# Patient Record
Sex: Male | Born: 1995 | Race: Black or African American | Hispanic: No | Marital: Single
Health system: Southern US, Community
[De-identification: ages and names within clinical notes are randomized; demographics above are authoritative.]

---
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
## 2020-12-14 ENCOUNTER — Emergency Department: Payer: Self-pay

## 2020-12-14 ENCOUNTER — Emergency Department
Admission: EM | Admit: 2020-12-14 | Discharge: 2020-12-14 | Disposition: A | Discharge status: Home / Self Care | Payer: Self-pay | Attending: Emergency Medicine | Admitting: Emergency Medicine

## 2020-12-14 ENCOUNTER — Other Ambulatory Visit: Payer: Self-pay

## 2020-12-14 DIAGNOSIS — M25512 Pain in left shoulder: Secondary | ICD-10-CM | POA: Insufficient documentation

## 2020-12-14 DIAGNOSIS — S0990XA Unspecified injury of head, initial encounter: Secondary | ICD-10-CM | POA: Insufficient documentation

## 2020-12-14 DIAGNOSIS — F10129 Alcohol abuse with intoxication, unspecified: Secondary | ICD-10-CM | POA: Insufficient documentation

## 2020-12-14 DIAGNOSIS — S02602A Fracture of unspecified part of body of left mandible, initial encounter for closed fracture: Secondary | ICD-10-CM | POA: Insufficient documentation

## 2020-12-14 DIAGNOSIS — F10929 Alcohol use, unspecified with intoxication, unspecified: Secondary | ICD-10-CM

## 2020-12-14 DIAGNOSIS — S01412A Laceration without foreign body of left cheek and temporomandibular area, initial encounter: Secondary | ICD-10-CM | POA: Insufficient documentation

## 2020-12-14 DIAGNOSIS — S02609A Fracture of mandible, unspecified, initial encounter for closed fracture: Secondary | ICD-10-CM

## 2020-12-14 DIAGNOSIS — Y9259 Other trade areas as the place of occurrence of the external cause: Secondary | ICD-10-CM | POA: Insufficient documentation

## 2020-12-14 MED ORDER — NAPROXEN 500 MG PO TABS: 500.0000 mg | ORAL_TABLET | Freq: Two times a day (BID) | ORAL | 2 refills | Status: AC
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
## 2020-12-14 NOTE — ED Notes (Signed)
Patient d/c in police custody in wheelchair to police vehicle. Instructions reviewed with patient and officer. Denies any needs or questions
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
## 2020-12-14 NOTE — ED Notes (Signed)
Patient facial wound cleaned with warm water and soap.
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
## 2024-01-22 ENCOUNTER — Emergency Department
Admission: EM | Admit: 2024-01-22 | Discharge: 2024-01-23 | Disposition: A | Discharge status: Other Acute Inpatient Hospital | Payer: Self-pay | Attending: Emergency Medicine | Admitting: Emergency Medicine

## 2024-01-22 ENCOUNTER — Other Ambulatory Visit: Payer: Self-pay

## 2024-01-22 DIAGNOSIS — S31119A Laceration without foreign body of abdominal wall, unspecified quadrant without penetration into peritoneal cavity, initial encounter: Secondary | ICD-10-CM | POA: Diagnosis not present

## 2024-01-22 DIAGNOSIS — S3991XA Unspecified injury of abdomen, initial encounter: Secondary | ICD-10-CM | POA: Diagnosis present

## 2024-01-22 DIAGNOSIS — S36502A Unspecified injury of descending [left] colon, initial encounter: Secondary | ICD-10-CM | POA: Diagnosis not present

## 2024-01-22 DIAGNOSIS — Z23 Encounter for immunization: Secondary | ICD-10-CM | POA: Diagnosis not present

## 2024-01-22 DIAGNOSIS — S40812A Abrasion of left upper arm, initial encounter: Secondary | ICD-10-CM | POA: Diagnosis not present

## 2024-01-22 DIAGNOSIS — S40811A Abrasion of right upper arm, initial encounter: Secondary | ICD-10-CM | POA: Insufficient documentation

## 2024-01-22 DIAGNOSIS — D72829 Elevated white blood cell count, unspecified: Secondary | ICD-10-CM | POA: Insufficient documentation

## 2024-01-22 DIAGNOSIS — S31109A Unspecified open wound of abdominal wall, unspecified quadrant without penetration into peritoneal cavity, initial encounter: Secondary | ICD-10-CM

## 2024-01-22 DIAGNOSIS — S36892A Contusion of other intra-abdominal organs, initial encounter: Secondary | ICD-10-CM | POA: Insufficient documentation
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
## 2024-01-23 ENCOUNTER — Emergency Department: Payer: Self-pay

## 2024-01-23 LAB — CBC WITH DIFFERENTIAL/PLATELET
Abs Immature Granulocytes: 0.04 K/uL (ref 0.00–0.07)
Basophils Absolute: 0.1 K/uL (ref 0.0–0.1)
Basophils Relative: 1 %
Eosinophils Absolute: 0.1 K/uL (ref 0.0–0.5)
Eosinophils Relative: 0 %
HCT: 42.2 % (ref 39.0–52.0)
Hemoglobin: 14.2 g/dL (ref 13.0–17.0)
Immature Granulocytes: 0 %
Lymphocytes Relative: 21 %
Lymphs Abs: 2.4 K/uL (ref 0.7–4.0)
MCH: 28.7 pg (ref 26.0–34.0)
MCHC: 33.6 g/dL (ref 30.0–36.0)
MCV: 85.3 fL (ref 80.0–100.0)
Monocytes Absolute: 0.6 K/uL (ref 0.1–1.0)
Monocytes Relative: 5 %
Neutro Abs: 8.4 K/uL — ABNORMAL HIGH (ref 1.7–7.7)
Neutrophils Relative %: 73 %
Platelets: 330 K/uL (ref 150–400)
RBC: 4.95 MIL/uL (ref 4.22–5.81)
RDW: 14.1 % (ref 11.5–15.5)
WBC: 11.6 K/uL — ABNORMAL HIGH (ref 4.0–10.5)
nRBC: 0 % (ref 0.0–0.2)

## 2024-01-23 LAB — COMPREHENSIVE METABOLIC PANEL WITH GFR
ALT: 16 U/L (ref 0–44)
AST: 27 U/L (ref 15–41)
Albumin: 4.8 g/dL (ref 3.5–5.0)
Alkaline Phosphatase: 43 U/L (ref 38–126)
Anion gap: 14 (ref 5–15)
BUN: 9 mg/dL (ref 6–20)
CO2: 23 mmol/L (ref 22–32)
Calcium: 9.2 mg/dL (ref 8.9–10.3)
Chloride: 108 mmol/L (ref 98–111)
Creatinine, Ser: 0.87 mg/dL (ref 0.61–1.24)
GFR, Estimated: >60 mL/min (ref >60)
Glucose, Bld: 79 mg/dL (ref 70–99)
Potassium: 3.3 mmol/L — ABNORMAL LOW (ref 3.5–5.1)
Sodium: 145 mmol/L (ref 135–145)
Total Bilirubin: 0.6 mg/dL (ref 0.0–1.2)
Total Protein: 8 g/dL (ref 6.5–8.1)

## 2024-01-23 LAB — TYPE AND SCREEN
ABO/RH(D): A POS
Antibody Screen: NEGATIVE

## 2024-01-23 LAB — PROTIME-INR
INR: 1.1 (ref 0.8–1.2)
Prothrombin Time: 15.2 s (ref 11.4–15.2)

## 2024-01-23 LAB — ETHANOL: Alcohol, Ethyl (B): 238 mg/dL — ABNORMAL HIGH (ref <15)

## 2024-01-23 MED ORDER — IOHEXOL 300 MG/ML  SOLN
100.0000 mL | Freq: Once | INTRAMUSCULAR | Status: AC | PRN
  Administered 2024-01-23: 100 mL via INTRAVENOUS

## 2024-01-23 MED ORDER — PIPERACILLIN-TAZOBACTAM 3.375 G IVPB 30 MIN
3.3750 g | Freq: Once | INTRAVENOUS | Status: AC
  Administered 2024-01-23: 3.375 g via INTRAVENOUS
  Filled 2024-01-23: qty 50

## 2024-01-23 MED ORDER — TETANUS-DIPHTH-ACELL PERTUSSIS 5-2.5-18.5 LF-MCG/0.5 IM SUSY
0.5000 mL | PREFILLED_SYRINGE | Freq: Once | INTRAMUSCULAR | Status: AC
  Administered 2024-01-23: 0.5 mL via INTRAMUSCULAR
  Filled 2024-01-23: qty 0.5

## 2024-01-23 NOTE — ED Notes (Signed)
 EMTALA reviewed by this RN.
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
